# Patient Record
Sex: Female | Born: 1965 | Race: Black or African American | Hispanic: No | Marital: Married | Smoking: Never smoker
Health system: Southern US, Community
[De-identification: ages and names within clinical notes are randomized; demographics above are authoritative.]

## PROBLEM LIST (undated history)

## (undated) DIAGNOSIS — I10 Essential (primary) hypertension: Secondary | ICD-10-CM

---

## 2016-11-20 ENCOUNTER — Encounter (HOSPITAL_BASED_OUTPATIENT_CLINIC_OR_DEPARTMENT_OTHER): Payer: Self-pay

## 2016-11-20 ENCOUNTER — Emergency Department (HOSPITAL_BASED_OUTPATIENT_CLINIC_OR_DEPARTMENT_OTHER)
Admission: EM | Admit: 2016-11-20 | Discharge: 2016-11-21 | Disposition: A | Discharge status: Home / Self Care | Payer: BLUE CROSS/BLUE SHIELD | Attending: Emergency Medicine | Admitting: Emergency Medicine

## 2016-11-20 ENCOUNTER — Emergency Department (HOSPITAL_BASED_OUTPATIENT_CLINIC_OR_DEPARTMENT_OTHER): Payer: BLUE CROSS/BLUE SHIELD

## 2016-11-20 DIAGNOSIS — I10 Essential (primary) hypertension: Secondary | ICD-10-CM | POA: Diagnosis not present

## 2016-11-20 DIAGNOSIS — R600 Localized edema: Secondary | ICD-10-CM | POA: Diagnosis present

## 2016-11-20 HISTORY — DX: Essential (primary) hypertension: I10

## 2016-11-20 NOTE — ED Provider Notes (Signed)
[facility] Provider Note: Taylor Dell, MD, FACEP  CSN: [id number] MRN: [id number] ARRIVAL: 11/20/16 at 2148 ROOM: [facility]09/[facility]09   CHIEF COMPLAINT  Leg Swelling   HISTORY OF PRESENT ILLNESS  Taylor Curry is a 51 y.o. female with a family history of pulmonary embolism. She is here with swelling and pain of the right lower leg that began this morning. She describes the pain as a sharp pain of the medial aspect of the lower leg radiating proximally. She rates the pain as an 8 out of 10. It is worse with palpation or ambulation. About 8:30 this evening she noted that her left lower leg was also edematous. There is no pain associated with the swelling of the left lower leg. There is no associated erythema or warmth. She denies injury to the legs. She first noticed some pain in her right lower leg while driving earlier this week. She denies chest pain or shortness of breath.   Past Medical History:  Diagnosis Date  . Hypertension     History reviewed. No pertinent surgical history.  No family history on file.  Social History  Substance Use Topics  . Smoking status: Never Smoker  . Smokeless tobacco: Never Used  . Alcohol use Yes     Comment: occ    Prior to Admission medications   Not on File    Allergies Patient has no known allergies.   REVIEW OF SYSTEMS  Negative except as noted here or in the History of Present Illness.   PHYSICAL EXAMINATION  Initial Vital Signs Blood pressure (!) 188/97, pulse 88, temperature 98 F (36.7 C), temperature source Oral, resp. rate 20, height 5\' 7"  (1.702 m), weight 296 lb (134.3 kg), SpO2 98 %.  Examination General: Well-developed, well-nourished female in no acute distress; appearance consistent with age of record HENT: normocephalic; atraumatic Eyes: pupils equal, round and reactive to light; extraocular muscles intact Neck: supple Heart: regular rate and rhythm Lungs: clear to auscultation bilaterally Abdomen:  soft; nondistended; nontender; bowel sounds present Extremities: No deformity; full range of motion; pulses normal; tenderness of medial right lower leg without erythema or warmth; +1 pitting edema of lower legs and feet bilaterally Neurologic: Awake, alert and oriented; motor function intact in all extremities and symmetric; no facial droop Skin: Warm and dry Psychiatric: Normal mood and affect   RESULTS  Summary of this visit's results, reviewed by myself:   EKG Interpretation  Date/Time:    Ventricular Rate:    PR Interval:    QRS Duration:   QT Interval:    QTC Calculation:   R Axis:     Text Interpretation:        Laboratory Studies: Results for orders placed or performed during the hospital encounter of 11/20/16 (from the past 24 hour(s))  CBC with Differential/Platelet     Status: None   Collection Time: 11/21/16 12:10 AM  Result Value Ref Range   WBC 7.8 4.0 - 10.5 K/uL   RBC 4.45 3.87 - 5.11 MIL/uL   Hemoglobin 13.1 12.0 - 15.0 g/dL   HCT 31.5 17.6 - 16.0 %   MCV 90.8 78.0 - 100.0 fL   MCH 29.4 26.0 - 34.0 pg   MCHC 32.4 30.0 - 36.0 g/dL   RDW 73.7 10.6 - 26.9 %   Platelets 222 150 - 400 K/uL   Neutrophils Relative % 51 %   Neutro Abs 4.0 1.7 - 7.7 K/uL   Lymphocytes Relative 36 %   Lymphs Abs 2.8  0.7 - 4.0 K/uL   Monocytes Relative 12 %   Monocytes Absolute 0.9 0.1 - 1.0 K/uL   Eosinophils Relative 1 %   Eosinophils Absolute 0.1 0.0 - 0.7 K/uL   Basophils Relative 0 %   Basophils Absolute 0.0 0.0 - 0.1 K/uL  Basic metabolic panel     Status: Abnormal   Collection Time: 11/21/16 12:10 AM  Result Value Ref Range   Sodium 141 135 - 145 mmol/L   Potassium 3.7 3.5 - 5.1 mmol/L   Chloride 106 101 - 111 mmol/L   CO2 27 22 - 32 mmol/L   Glucose, Bld 117 (H) 65 - 99 mg/dL   BUN 25 (H) 6 - 20 mg/dL   Creatinine, Ser 0.450.93 0.44 - 1.00 mg/dL   Calcium 9.4 8.9 - 40.910.3 mg/dL   GFR calc non Af Amer >60 >60 mL/min   GFR calc Af Amer >60 >60 mL/min   Anion gap 8 5 -  15   Imaging Studies: [place] Venous Img Lower Bilateral  Result Date: 11/21/2016 CLINICAL DATA:  Bilateral lower extremity pain and swelling. EXAM: BILATERAL LOWER EXTREMITY VENOUS DOPPLER ULTRASOUND TECHNIQUE: Gray-scale sonography with graded compression, as well as color Doppler and duplex ultrasound were performed to evaluate the lower extremity deep venous systems from the level of the common femoral vein and including the common femoral, femoral, profunda femoral, popliteal and calf veins including the posterior tibial, peroneal and gastrocnemius veins when visible. The superficial great saphenous vein was also interrogated. Spectral Doppler was utilized to evaluate flow at rest and with distal augmentation maneuvers in the common femoral, femoral and popliteal veins. COMPARISON:  None. FINDINGS: RIGHT LOWER EXTREMITY Common Femoral Vein: No evidence of thrombus. Normal compressibility, respiratory phasicity and response to augmentation. Saphenofemoral Junction: No evidence of thrombus. Normal compressibility and flow on color Doppler imaging. Profunda Femoral Vein: No evidence of thrombus. Normal compressibility and flow on color Doppler imaging. Femoral Vein: No evidence of thrombus. Normal compressibility, respiratory phasicity and response to augmentation. Popliteal Vein: No evidence of thrombus. Normal compressibility, respiratory phasicity and response to augmentation. Calf Veins: No evidence of thrombus. Normal compressibility and flow on color Doppler imaging. Superficial Great Saphenous Vein: No evidence of thrombus. Normal compressibility and flow on color Doppler imaging. Venous Reflux:  None. Other Findings:  None. LEFT LOWER EXTREMITY Common Femoral Vein: No evidence of thrombus. Normal compressibility, respiratory phasicity and response to augmentation. Saphenofemoral Junction: No evidence of thrombus. Normal compressibility and flow on color Doppler imaging. Profunda Femoral Vein: No evidence  of thrombus. Normal compressibility and flow on color Doppler imaging. Femoral Vein: No evidence of thrombus. Normal compressibility, respiratory phasicity and response to augmentation. Popliteal Vein: No evidence of thrombus. Normal compressibility, respiratory phasicity and response to augmentation. Calf Veins: No evidence of thrombus. Normal compressibility and flow on color Doppler imaging. Superficial Great Saphenous Vein: No evidence of thrombus. Normal compressibility and flow on color Doppler imaging. Venous Reflux:  None. Other Findings:  None. IMPRESSION: No evidence of DVT within either lower extremity. Electronically Signed   By: Rubye OaksMelanie  Ehinger M.D.   On: 11/21/2016 00:25    ED COURSE  Nursing notes and initial vitals signs, including pulse oximetry, reviewed.  Vitals:   11/20/16 2205 11/21/16 0022  BP: (!) 188/97 (!) 146/71  Pulse: 88 83  Resp: 20 18  Temp: 98 F (36.7 C)   TempSrc: Oral   SpO2: 98% 97%  Weight: 296 lb (134.3 kg)   Height: 5\' 7"  (1.702 m)  Patient is a patient of [facility] with whom she will follow-up. She was advised of reassuring lab and ultrasound findings.  PROCEDURES    ED DIAGNOSES     ICD-9-CM ICD-10-CM   1. Bilateral lower extremity edema 782.3 R60.0        Tanasha Menees, MD 11/21/16 8119

## 2016-11-20 NOTE — ED Triage Notes (Signed)
c/o woke today with pain/swelling to right LE-later in the day swelling developed to left LE-NAD-slow limping gait

## 2016-11-20 NOTE — ED Notes (Signed)
Patient transported to Ultrasound 

## 2016-11-21 LAB — BASIC METABOLIC PANEL
ANION GAP: 8 (ref 5–15)
BUN: 25 mg/dL — ABNORMAL HIGH (ref 6–20)
CHLORIDE: 106 mmol/L (ref 101–111)
CO2: 27 mmol/L (ref 22–32)
Calcium: 9.4 mg/dL (ref 8.9–10.3)
Creatinine, Ser: 0.93 mg/dL (ref 0.44–1.00)
GFR calc Af Amer: >60 mL/min (ref >60)
GLUCOSE: 117 mg/dL — AB (ref 65–99)
POTASSIUM: 3.7 mmol/L (ref 3.5–5.1)
Sodium: 141 mmol/L (ref 135–145)

## 2016-11-21 LAB — CBC WITH DIFFERENTIAL/PLATELET
Basophils Absolute: 0 10*3/uL (ref 0.0–0.1)
Basophils Relative: 0 %
EOS PCT: 1 %
Eosinophils Absolute: 0.1 10*3/uL (ref 0.0–0.7)
HCT: 40.4 % (ref 36.0–46.0)
HEMOGLOBIN: 13.1 g/dL (ref 12.0–15.0)
LYMPHS PCT: 36 %
Lymphs Abs: 2.8 10*3/uL (ref 0.7–4.0)
MCH: 29.4 pg (ref 26.0–34.0)
MCHC: 32.4 g/dL (ref 30.0–36.0)
MCV: 90.8 fL (ref 78.0–100.0)
Monocytes Absolute: 0.9 10*3/uL (ref 0.1–1.0)
Monocytes Relative: 12 %
NEUTROS PCT: 51 %
Neutro Abs: 4 10*3/uL (ref 1.7–7.7)
PLATELETS: 222 10*3/uL (ref 150–400)
RBC: 4.45 MIL/uL (ref 3.87–5.11)
RDW: 14.2 % (ref 11.5–15.5)
WBC: 7.8 10*3/uL (ref 4.0–10.5)

## 2016-11-21 NOTE — ED Notes (Signed)
Pt verbalizes understanding of d/c instructions and denies any further needs at this time. 

## 2018-02-02 IMAGING — US US EXTREM LOW VENOUS BILAT
1 series · 13 of 24 positions shown · non-contrast
Comparison: None.

CLINICAL DATA: Bilateral lower extremity pain and swelling.



[Series 1: us extrem low venous bilat · 0.08mm/px · 40 acquisitions, 13 frames shown]
[im 1/40]
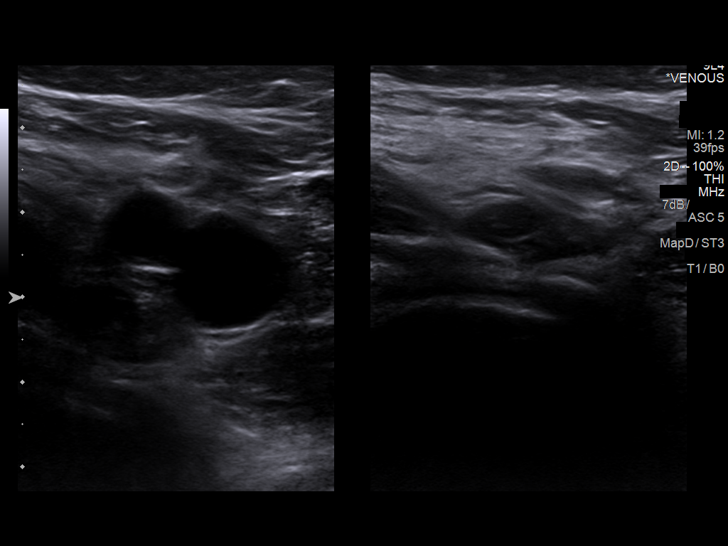
[im 4/40]
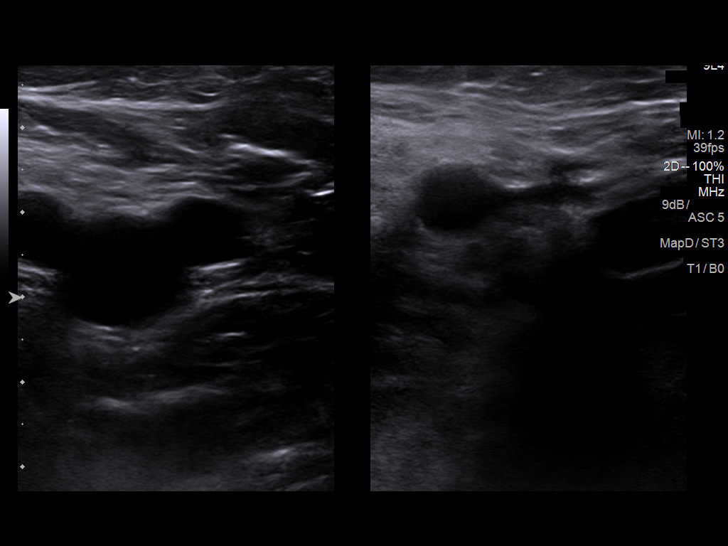
[im 7/40]
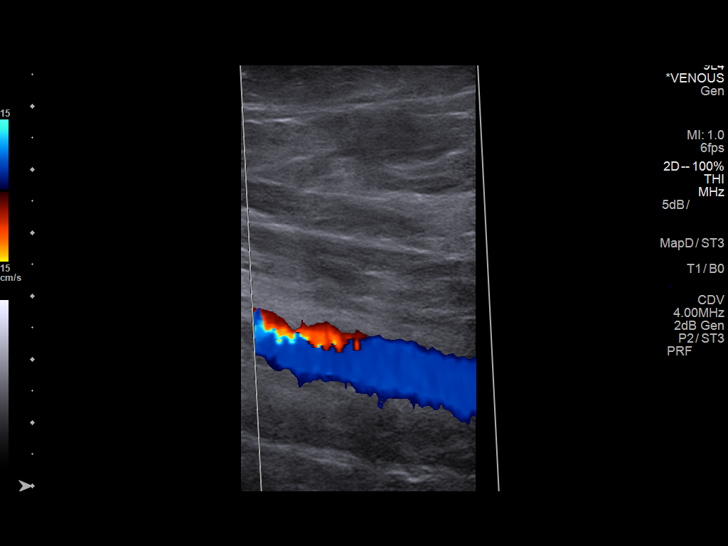
[im 11/40]
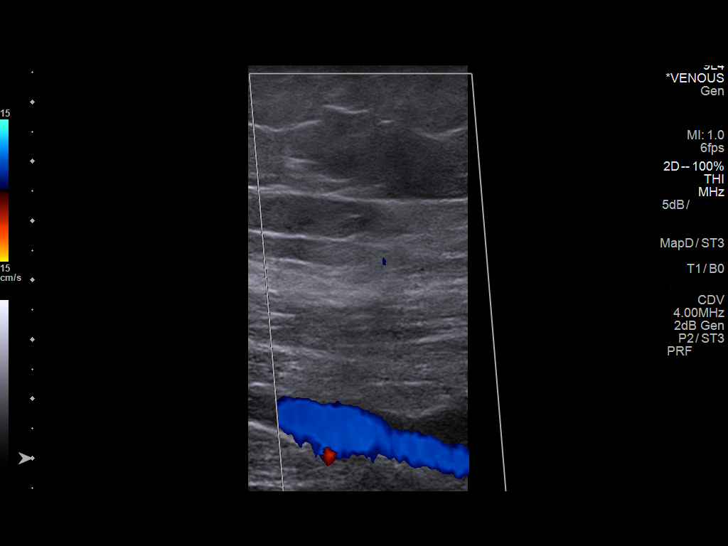
[im 14/40]
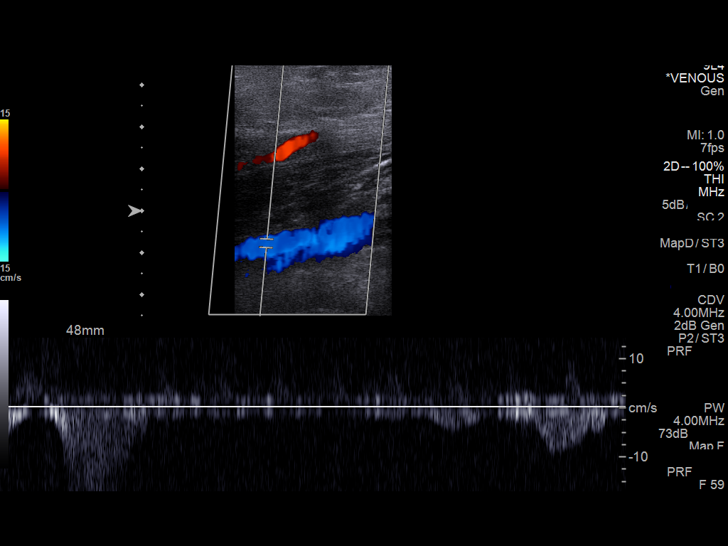
[im 17/40]
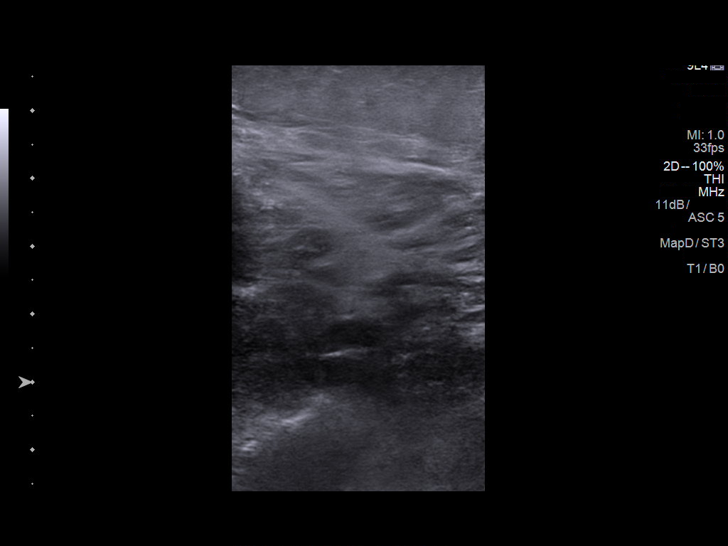
[im 21/40]
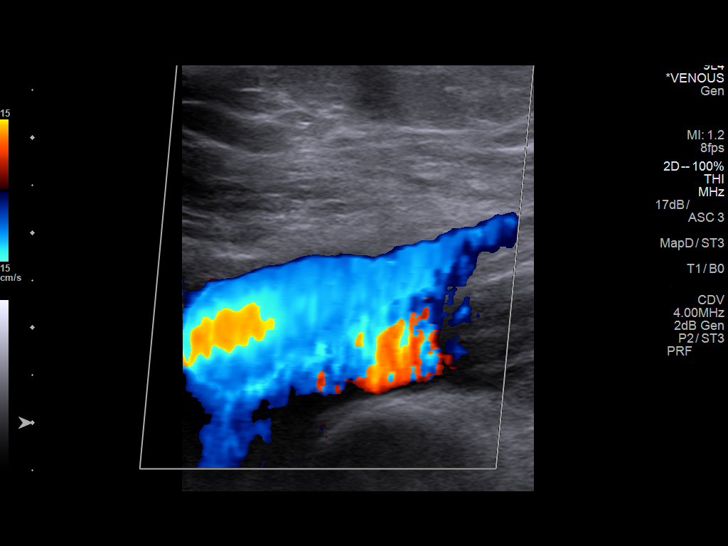
[im 23/40]
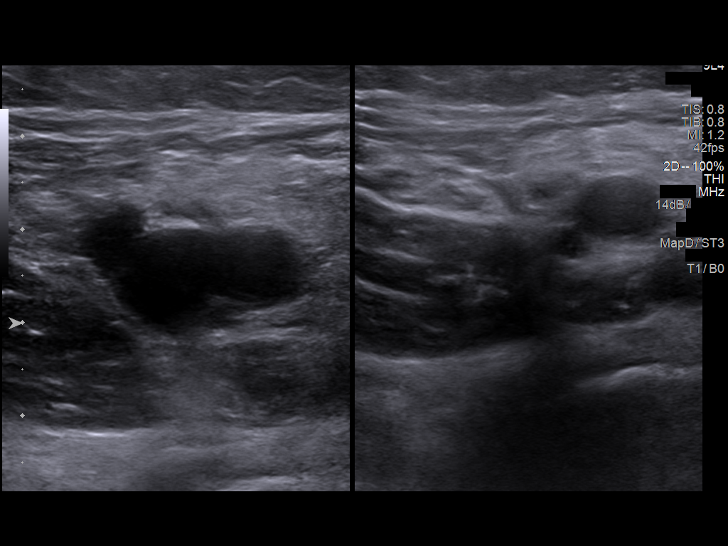
[im 26/40]
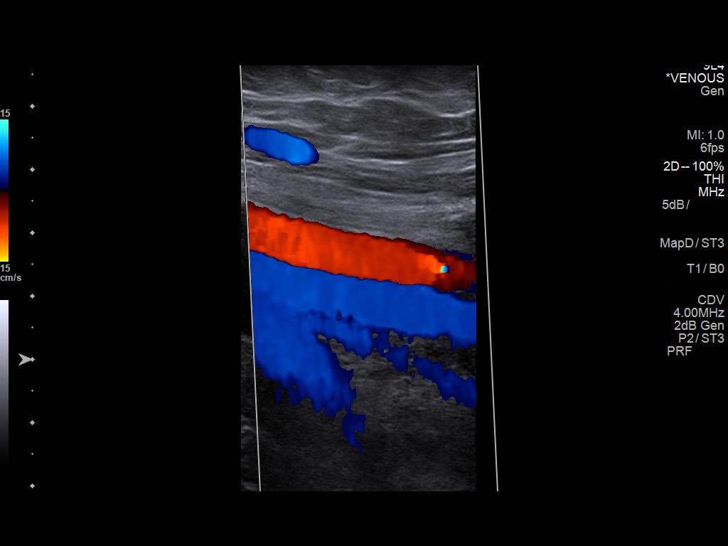
[im 29/40]
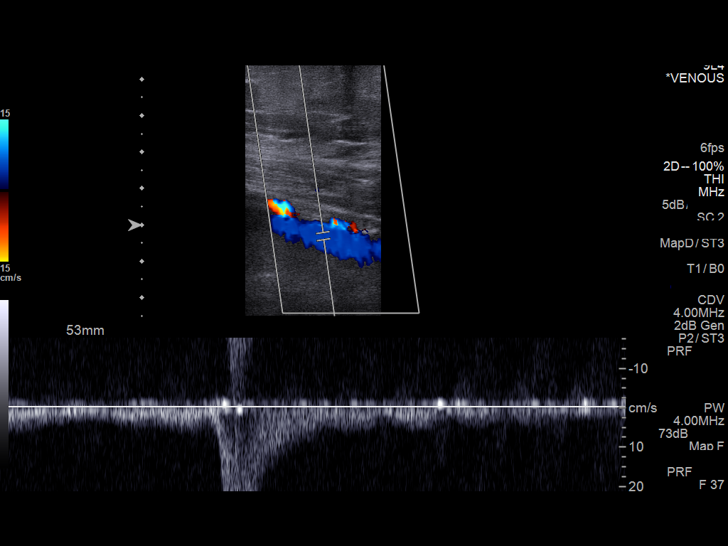
[im 33/40]
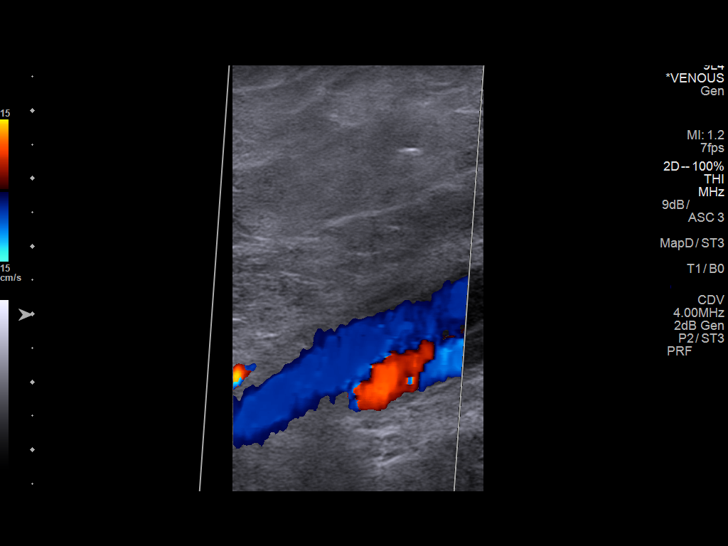
[im 36/40]
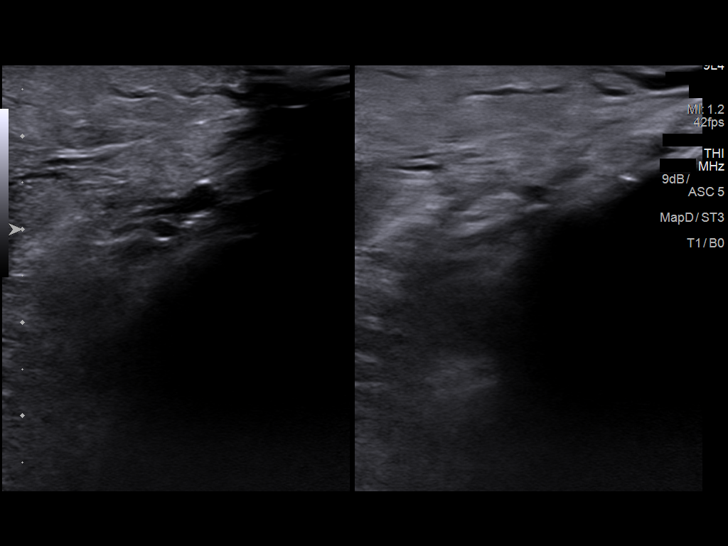
[im 40/40]
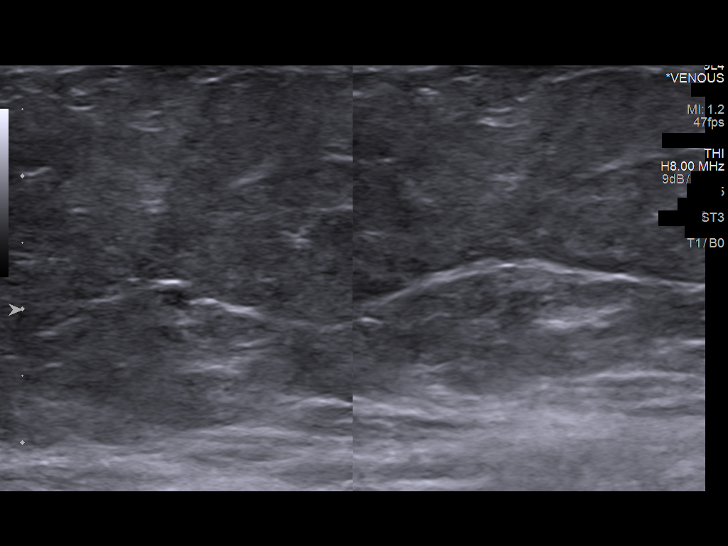

[13 of 24 positions shown; findings below may reference images not displayed]

FINDINGS: RIGHT LOWER EXTREMITY

Common Femoral Vein: No evidence of thrombus. Normal
compressibility, respiratory phasicity and response to augmentation.

Saphenofemoral Junction: No evidence of thrombus. Normal
compressibility and flow on color Doppler imaging.

Profunda Femoral Vein: No evidence of thrombus. Normal
compressibility and flow on color Doppler imaging.

Femoral Vein: No evidence of thrombus. Normal compressibility,
respiratory phasicity and response to augmentation.

Popliteal Vein: No evidence of thrombus. Normal compressibility,
respiratory phasicity and response to augmentation.

Calf Veins: No evidence of thrombus. Normal compressibility and flow
on color Doppler imaging.

Superficial Great Saphenous Vein: No evidence of thrombus. Normal
compressibility and flow on color Doppler imaging.

Venous Reflux:  None.

Other Findings:  None.

LEFT LOWER EXTREMITY

Common Femoral Vein: No evidence of thrombus. Normal
compressibility, respiratory phasicity and response to augmentation.

Saphenofemoral Junction: No evidence of thrombus. Normal
compressibility and flow on color Doppler imaging.

Profunda Femoral Vein: No evidence of thrombus. Normal
compressibility and flow on color Doppler imaging.

Femoral Vein: No evidence of thrombus. Normal compressibility,
respiratory phasicity and response to augmentation.

Popliteal Vein: No evidence of thrombus. Normal compressibility,
respiratory phasicity and response to augmentation.

Calf Veins: No evidence of thrombus. Normal compressibility and flow
on color Doppler imaging.

Superficial Great Saphenous Vein: No evidence of thrombus. Normal
compressibility and flow on color Doppler imaging.

Venous Reflux:  None.

Other Findings:  None.
IMPRESSION: No evidence of DVT within either lower extremity.
